# Patient Record
Sex: Female | Born: 1990 | Hispanic: Yes | Smoking: Never smoker
Health system: Southern US, Community
[De-identification: ages and names within clinical notes are randomized; demographics above are authoritative.]

## PROBLEM LIST (undated history)

## (undated) HISTORY — PX: OVARIAN CYST SURGERY: SHX726

---

## 2021-02-16 ENCOUNTER — Ambulatory Visit (LOCAL_COMMUNITY_HEALTH_CENTER): Payer: Medicaid Other

## 2021-02-16 ENCOUNTER — Other Ambulatory Visit: Payer: Self-pay

## 2021-02-16 VITALS — BP 102/71 | Wt 143.0 lb

## 2021-02-16 DIAGNOSIS — Z3201 Encounter for pregnancy test, result positive: Secondary | ICD-10-CM

## 2021-02-16 MED ORDER — PRENATAL VITAMINS 28-0.8 MG PO TABS: 1.0000 | ORAL_TABLET | ORAL | 0 refills | Status: AC

## 2021-02-16 NOTE — Progress Notes (Signed)
Patient in clinic today for PT.  Patient informed of positive pregnancy.  Patient request prenatal care at [facility].  Patient referred to [facility] and [facility] for preadmission for services today.  Patient to contact [facility] to initiate care. Patient questions answered.  PNV and pregnancy packet given and reviewed.    Wendi Snipes, FNP

## 2021-02-19 NOTE — Progress Notes (Signed)
Chart reviewed by Pharmacist  Suzanne Walker PharmD, Contract Pharmacist at [facility]  

## 2021-02-27 ENCOUNTER — Other Ambulatory Visit: Payer: Self-pay

## 2021-02-27 ENCOUNTER — Encounter: Payer: Self-pay | Admitting: Advanced Practice Midwife

## 2021-02-27 ENCOUNTER — Ambulatory Visit: Payer: Medicaid Other | Admitting: Advanced Practice Midwife

## 2021-02-27 DIAGNOSIS — Z3481 Encounter for supervision of other normal pregnancy, first trimester: Secondary | ICD-10-CM | POA: Diagnosis not present

## 2021-02-27 DIAGNOSIS — Z3480 Encounter for supervision of other normal pregnancy, unspecified trimester: Secondary | ICD-10-CM | POA: Insufficient documentation

## 2021-02-27 DIAGNOSIS — R54 Age-related physical debility: Secondary | ICD-10-CM

## 2021-02-27 DIAGNOSIS — Z23 Encounter for immunization: Secondary | ICD-10-CM

## 2021-02-27 LAB — URINALYSIS
Bilirubin, UA: NEGATIVE
Glucose, UA: NEGATIVE
Ketones, UA: NEGATIVE
Leukocytes,UA: NEGATIVE
Nitrite, UA: NEGATIVE
Protein,UA: NEGATIVE
Specific Gravity, UA: 1.005 (ref 1.005–1.030)
Urobilinogen, Ur: 0.2 mg/dL (ref 0.2–1.0)
pH, UA: 6 (ref 5.0–7.5)

## 2021-02-27 LAB — WET PREP FOR TRICH, YEAST, CLUE
Trichomonas Exam: NEGATIVE
Yeast Exam: NEGATIVE

## 2021-02-27 LAB — HEMOGLOBIN, FINGERSTICK: Hemoglobin: 11.9 g/dL (ref 11.1–15.9)

## 2021-02-27 NOTE — Progress Notes (Addendum)
Patient here for new OB visit at about 12 5/7.  Lives with partner and her son. No [facility], no [facility] records. FOB is working and client not working outside her home. FOB, Ranae Pila, is in the waiting area and would like to come in to clinic to listen to baby's heartbeat. [place] last year [place] Flu vaccine given, tolerated well, VIS given. Peds list given, [facility] pamphlet given and [employer] number given as patient interested in BTL.Marland KitchenBurt Knack, RN

## 2021-02-27 NOTE — Progress Notes (Signed)
[facility] and [facility] contact card given. Burt Knack, RN

## 2021-02-27 NOTE — Progress Notes (Signed)
[place] DEPT [facility] [address] [phone]  INITIAL PRENATAL VISIT NOTE  Subjective:  Kristi Washington is a 30 y.o. nonsmoker G47P1001(11 yo son) at 10w5dbeing seen today [facility]. She feels "happy" about planned pregnancy x 4 years. 30yo employed FOB feels "happy" about pregnancy; in supportive 1 year relationship; he has 2 children with another woman (388and 230yo). [place] FOB [place]. She is unemployed and living with FOB and her son. LMP 11/30/20. Denies ER use or u/s this pregnancy. Denies cigs, vaping, cigars, MJ. Last ETOH 04/2020 (2 glasses wine). Last dental exam 2021. Completed 12th grade.  [place] 2021 [place]. Wants Quad screen.  She is currently monitored for the following issues for this low-risk pregnancy and has Prenatal care, subsequent pregnancy, first trimester on their problem list.  Patient reports no complaints.  Contractions: Not present. Vag. Bleeding: None.  Movement: Absent. Denies leaking of fluid.   Indications for ASA therapy (per uptodate) One of the following: Previous pregnancy with preeclampsia, especially early onset and with an adverse outcome No Multifetal gestation No Chronic hypertension No Type 1 or 2 diabetes mellitus No Chronic kidney disease No Autoimmune disease (antiphospholipid syndrome, systemic lupus erythematosus) No  Two or more of the following: Nulliparity No Obesity (body mass index >30 kg/m2) No Family history of preeclampsia in mother or sister No Age ?35 years No Sociodemographic characteristics (African American race, low socioeconomic level) Yes Personal risk factors (eg, previous pregnancy with low birth weight or small for gestational age infant, previous adverse pregnancy  outcome [eg, stillbirth], interval >10 years between pregnancies) Yes   The following portions of the patient's history were reviewed and updated as appropriate: allergies, current medications, past family history, past medical history, past social history, past surgical history and problem list. Problem list updated.  Objective:   Vitals:   02/27/21 0906 02/27/21 0917  BP: 115/71   Pulse: 88   Temp: 97.7 F (36.5 C)   Weight: 143 lb 3.2 oz (65 kg)   Height:  _0  (1.549 m)    Fetal Status: Fetal Heart Rate (bpm): 160 Fundal Height: 16 cm Movement: Absent  Presentation: Undeterminable   Physical Exam Vitals and nursing note reviewed.  Constitutional:      General: She is not in acute distress.    Appearance: Normal appearance. She is well-developed and normal weight.  HENT:     Head: Normocephalic and atraumatic.     Right Ear: External ear normal.     Left Ear: External ear normal.     Nose: Nose normal. No congestion or rhinorrhea.     Mouth/Throat:     Lips: Pink.     Mouth: Mucous membranes are moist.     Dentition: Normal dentition. No dental caries.     Pharynx: Oropharynx is clear. Uvula midline.     Comments: Dentition: good; last dental exam 2021 Eyes:     General: No scleral icterus.    Conjunctiva/sclera: Conjunctivae normal.  Neck:     Thyroid: No thyroid mass, thyromegaly or thyroid tenderness.  Cardiovascular:     Rate and Rhythm: Normal rate.     Pulses: Normal pulses.     Comments: Extremities are warm and well perfused Pulmonary:  Effort: Pulmonary effort is normal.     Breath sounds: Normal breath sounds.  Chest:     Chest wall: No mass.  Breasts:    Tanner Score is 5.     Breasts are symmetrical.     Right: Normal. No mass, nipple discharge or skin change.     Left: Normal. No mass, nipple discharge or skin change.  Abdominal:     Palpations: Abdomen is soft.     Tenderness: There is no abdominal tenderness.     Comments: Gravid, soft  without masses or tenderness; FH=16 wks, FHR=160  Genitourinary:    General: Normal vulva.     Exam position: Lithotomy position.     Pubic Area: No rash.      Labia:        Right: No rash.        Left: No rash.      Vagina: Normal. No vaginal discharge (white creamy leukorrhea, ph<4.5).     Cervix: Friability (friable to pap) present. No cervical motion tenderness.     Uterus: Normal. Enlarged (Gravid 16 wks size). Not tender.      Rectum: Normal. No external hemorrhoid.  Musculoskeletal:     Right lower leg: No edema.     Left lower leg: No edema.  Lymphadenopathy:     Cervical: No cervical adenopathy.     Upper Body:     Right upper body: No axillary adenopathy.     Left upper body: No axillary adenopathy.  Skin:    General: Skin is warm.     Capillary Refill: Capillary refill takes less than 2 seconds.  Neurological:     Mental Status: She is alert.    Assessment and Plan:  Pregnancy: G2P1001 at [redacted]w[redacted]d 1. Prenatal care, subsequent pregnancy, first trimester Desires Quad screen Please give pt dental list and encourage exam asap Dating u/s ordered Counseled on weight gain of 15-25 lbs this pregnancy - Hgb Fractionation Cascade - Chlamydia/GC NAA, Confirmation - HCV Ab w Reflex to Quant PCR - Urine Culture - CBC/D/Plt+RPR+Rh+ABO+Rub Ab... - HIV-1/HIV-2 Qualitative RNA - QuantiFERON-TB Gold Plus -- 737106Drug Screen - IGP, rfx Aptima HPV ASCU - WET PREP FOR TRICH, YEAST, CLUE - Urinalysis (Urine Dip) - Pregnancy, urine    Discussed overview of care and coordination with inpatient delivery practices including [facility]   Reviewed Centering pregnancy as standard of care at [facility]    Preterm labor symptoms and general obstetric precautions including but not limited to vaginal bleeding, contractions, leaking of fluid and fetal movement were reviewed in detail with the patient.  Please refer to After Visit Summary for other  counseling recommendations.   No follow-ups on file.  No future appointments.  EHerbie Saxon CNM

## 2021-02-28 LAB — CBC/D/PLT+RPR+RH+ABO+RUB AB...
Antibody Screen: NEGATIVE
Basophils Absolute: 0.1 10*3/uL (ref 0.0–0.2)
Basos: 1 %
EOS (ABSOLUTE): 0.2 10*3/uL (ref 0.0–0.4)
Eos: 2 %
Hematocrit: 36.1 % (ref 34.0–46.6)
Hemoglobin: 12.2 g/dL (ref 11.1–15.9)
Hepatitis B Surface Ag: NEGATIVE
Immature Grans (Abs): 0 10*3/uL (ref 0.0–0.1)
Immature Granulocytes: 0 %
Lymphocytes Absolute: 3.1 10*3/uL (ref 0.7–3.1)
Lymphs: 35 %
MCH: 31.2 pg (ref 26.6–33.0)
MCHC: 33.8 g/dL (ref 31.5–35.7)
MCV: 92 fL (ref 79–97)
Monocytes Absolute: 0.5 10*3/uL (ref 0.1–0.9)
Monocytes: 6 %
Neutrophils Absolute: 5.1 10*3/uL (ref 1.4–7.0)
Neutrophils: 56 %
Platelets: 337 10*3/uL (ref 150–450)
RBC: 3.91 x10E6/uL (ref 3.77–5.28)
RDW: 12.7 % (ref 11.7–15.4)
RPR Ser Ql: NONREACTIVE
Rh Factor: POSITIVE
Rubella Antibodies, IGG: 6.86 index (ref >0.99)
Varicella zoster IgG: 375 index (ref >165)
WBC: 9.1 10*3/uL (ref 3.4–10.8)

## 2021-02-28 LAB — HCV AB W REFLEX TO QUANT PCR: HCV Ab: <0.1 s/co ratio (ref 0.0–0.9)

## 2021-02-28 LAB — HCV INTERPRETATION

## 2021-03-01 LAB — 789231 7+OXYCODONE-BUND
Amphetamines, Urine: NEGATIVE ng/mL
BENZODIAZ UR QL: NEGATIVE ng/mL
Barbiturate screen, urine: NEGATIVE ng/mL
Cannabinoid Quant, Ur: NEGATIVE ng/mL
Cocaine (Metab.): NEGATIVE ng/mL
OPIATE SCREEN URINE: NEGATIVE ng/mL
Oxycodone/Oxymorphone, Urine: NEGATIVE ng/mL
PCP Quant, Ur: NEGATIVE ng/mL

## 2021-03-01 LAB — IGP, RFX APTIMA HPV ASCU: PAP Smear Comment: 0

## 2021-03-01 LAB — CHLAMYDIA/GC NAA, CONFIRMATION
Chlamydia trachomatis, NAA: NEGATIVE
Neisseria gonorrhoeae, NAA: NEGATIVE

## 2021-03-02 LAB — URINE CULTURE: Organism ID, Bacteria: NO GROWTH

## 2021-03-04 LAB — HGB FRACTIONATION CASCADE
Hgb A2: 2.8 % (ref 1.8–3.2)
Hgb A: 97.2 % (ref 96.4–98.8)
Hgb F: 0 % (ref 0.0–2.0)
Hgb S: 0 %

## 2021-03-04 LAB — QUANTIFERON-TB GOLD PLUS
QuantiFERON Mitogen Value: >10 [IU]/mL
QuantiFERON Nil Value: 2.79 [IU]/mL
QuantiFERON TB1 Ag Value: >10 [IU]/mL
QuantiFERON TB2 Ag Value: >10 [IU]/mL
QuantiFERON-TB Gold Plus: POSITIVE — AB

## 2021-03-04 LAB — HIV-1/HIV-2 QUALITATIVE RNA
HIV-1 RNA, Qualitative: NONREACTIVE
HIV-2 RNA, Qualitative: NONREACTIVE

## 2021-03-06 ENCOUNTER — Encounter: Payer: Self-pay | Admitting: Advanced Practice Midwife

## 2021-03-06 DIAGNOSIS — R7612 Nonspecific reaction to cell mediated immunity measurement of gamma interferon antigen response without active tuberculosis: Secondary | ICD-10-CM | POA: Insufficient documentation

## 2021-03-09 ENCOUNTER — Telehealth: Payer: Self-pay

## 2021-03-09 ENCOUNTER — Ambulatory Visit (LOCAL_COMMUNITY_HEALTH_CENTER): Payer: Medicaid Other

## 2021-03-09 VITALS — Wt 142.0 lb

## 2021-03-09 DIAGNOSIS — R7612 Nonspecific reaction to cell mediated immunity measurement of gamma interferon antigen response without active tuberculosis: Secondary | ICD-10-CM

## 2021-03-09 NOTE — Progress Notes (Signed)
Patient interviewed by phone. Patient was screened for TB during maternity visit, she is approximately [redacted] weeks along. She was last seen in maternity on 02/27/2021.  Labs from 02/27/2021: QFT+ QuantiFERON TB1 Ag Value IU/mL >10.00  QuantiFERON TB2 Ag Value IU/mL >10.00  QuantiFERON Nil Value IU/mL 2.79  QuantiFERON Mitogen Value IU/mL >10.00  QuantiFERON-TB Gold Plus Negative Positive Abnormal    HIV-1 RNA, Qualitative Non Reactive HIV-2 RNA, Qualitative Non Reactive  Drug screen negative HBV, HCV negative  The patient is spanish speaking, an interpreter was used. She has no known TB contacts and does not complain of any symptoms concerning for TB at present. She does not have any medical conditions or social habits that would make her high risk for TB.  The patient was instructed that she should obtain a chest x-ray in order to determine whether or not she has active TB and that once her CXR is completed we would notify her of the results and discuss treatment options.   Irena Cords, MD

## 2021-03-10 ENCOUNTER — Other Ambulatory Visit: Payer: Self-pay | Admitting: Family Medicine

## 2021-03-10 ENCOUNTER — Ambulatory Visit: Admission: RE | Admit: 2021-03-10 | Payer: Self-pay | Source: Home / Self Care

## 2021-03-10 ENCOUNTER — Ambulatory Visit
Admission: RE | Admit: 2021-03-10 | Discharge: 2021-03-10 | Disposition: A | Discharge status: Home / Self Care | Payer: Self-pay | Source: Ambulatory Visit | Attending: Family Medicine | Admitting: Family Medicine

## 2021-03-10 ENCOUNTER — Telehealth: Payer: Self-pay

## 2021-03-10 ENCOUNTER — Other Ambulatory Visit: Payer: Self-pay

## 2021-03-10 DIAGNOSIS — R7612 Nonspecific reaction to cell mediated immunity measurement of gamma interferon antigen response without active tuberculosis: Secondary | ICD-10-CM

## 2021-03-10 NOTE — Telephone Encounter (Signed)
Epi completed via phone.  Irena Cords MD

## 2021-03-10 NOTE — Telephone Encounter (Signed)
Call from Angie at [facility] as client there for CXR 1 view due to recent positive Quantiferon TB Gold test result. If pregnant, [facility] requires client to sign consent for XRAY after counseling by MD regarding risks. Angie requesting to speak specifically with Dr. Alvester Morin so client could be counseled (RN not acceptable). As Dr. Alvester Morin at [facility] today, unable to speak with client and Karoline Caldwell states she will have her radiologist speak with client. Jossie Ng, RN

## 2021-03-22 ENCOUNTER — Telehealth: Payer: Self-pay | Admitting: Surgery

## 2021-03-22 NOTE — Telephone Encounter (Signed)
Patient went for CXR on 12/2 but when asked to sign consent form re: CXR during pregnancy she refused because staff said she had to have specific counseling with an MD.   Spanish interpreter used to counsel patient that diagnostic levels of radiation (versus high dose radiation for treatment) of the extremities and chest are very low risk for birth defects. High dose radiation to the abdomen, and during the first 16 weeks of pregnancy can cause potential developmental abnormalities such as growth restriction or learning disabilities.  This patient will be shielded, only the chest will be imaged, diagnostic levels of radiation will be used and the fetus is at 16 weeks of development, thus the risks of birth defects is extremely low.   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   Jennye Moccasin, MD

## 2021-03-24 ENCOUNTER — Ambulatory Visit
Admission: RE | Admit: 2021-03-24 | Discharge: 2021-03-24 | Disposition: A | Discharge status: Home / Self Care | Payer: Self-pay | Source: Ambulatory Visit | Attending: Family Medicine | Admitting: Family Medicine

## 2021-03-24 ENCOUNTER — Other Ambulatory Visit: Payer: Self-pay | Admitting: Family Medicine

## 2021-03-24 ENCOUNTER — Ambulatory Visit
Admission: RE | Admit: 2021-03-24 | Discharge: 2021-03-24 | Disposition: A | Discharge status: Home / Self Care | Payer: Self-pay | Attending: Family Medicine | Admitting: Family Medicine

## 2021-03-24 DIAGNOSIS — R7612 Nonspecific reaction to cell mediated immunity measurement of gamma interferon antigen response without active tuberculosis: Secondary | ICD-10-CM

## 2021-03-27 ENCOUNTER — Encounter: Payer: Self-pay | Admitting: Nurse Practitioner

## 2021-03-27 ENCOUNTER — Ambulatory Visit: Payer: Medicaid Other | Admitting: Nurse Practitioner

## 2021-03-27 ENCOUNTER — Other Ambulatory Visit: Payer: Self-pay

## 2021-03-27 VITALS — BP 106/72 | HR 100 | Temp 97.9°F | Wt 145.4 lb

## 2021-03-27 DIAGNOSIS — Z3482 Encounter for supervision of other normal pregnancy, second trimester: Secondary | ICD-10-CM

## 2021-03-27 NOTE — Progress Notes (Signed)
Aware of [facility] [place] appt 04/07/2021. Verified has [facility] [place] appt. Desires Quad screen today. Jossie Ng, RN

## 2021-03-27 NOTE — Progress Notes (Signed)
[facility] M[facility] Health Clinic  PRENATAL VISIT NOTE  Subjective:  Kristi Washington is a 30 y.o. G2P1001 at [redacted]w[redacted]d being seen today for ongoing prenatal care.  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  Patient reports no complaints.  Contractions: Not present. Vag. Bleeding: None.  Movement: Absent. Denies leaking of fluid/ROM.   The following portions of the patient's history were reviewed and updated as appropriate: allergies, current medications, past family history, past medical history, past social history, past surgical history and problem list. Problem list updated.  Objective:   Vitals:   03/27/21 1005  BP: 106/72  Pulse: 100  Temp: 97.9 F (36.6 C)  Weight: 145 lb 6.4 oz (66 kg)    Fetal Status: Fetal Heart Rate (bpm): 150 Fundal Height: 18 cm Movement: Absent     General:  Alert, oriented and cooperative. Patient is in no acute distress.  Skin: Skin is warm and dry. No rash noted.   Cardiovascular: Normal heart rate noted  Respiratory: Normal respiratory effort, no problems with respiration noted  Abdomen: Soft, gravid, appropriate for gestational age.  Pain/Pressure: Absent     Pelvic: Cervical exam deferred        Extremities: Normal range of motion.  Edema: None  Mental Status: Normal mood and affect. Normal behavior. Normal judgment and thought content.   Assessment and Plan:  Pregnancy: G2P1001 at [redacted]w[redacted]d  1. Prenatal care, subsequent pregnancy, second trimester -Patient is a 30 year old female in clinic today for a routine prenatal visit. -Patient has no complaints, states she is taking her PNV daily. -Patient sent to the lab today for Quad screen. -Next appointment in 4 weeks.  - AFP TETRA   Term labor symptoms and general  obstetric precautions including but not limited to vaginal bleeding, contractions, leaking of fluid and fetal movement were reviewed in detail with the patient. Please refer to After Visit Summary for other counseling recommendations.   Due to language barrier, an interpreter Roddie Mc Yemen) was used for the provider portion of the visit.    Return in about 4 weeks (around 04/24/2021) for Routine prenatal care visit.    Glenna Fellows, FNP

## 2021-03-30 ENCOUNTER — Telehealth: Payer: Self-pay | Admitting: Surgery

## 2021-03-30 LAB — AFP TETRA
DIA Value (EIA): 396.8 pg/mL
DSR (By Age)    1 IN: 716
Gestational Age: 14 WEEKS
MSAFP: 33.4 ng/mL
MSHCG: 36289 m[IU]/mL
Maternal Age At EDD: 29.8 yr
T18 (By Age): 1:2788 {titer}
Weight: 145 [lb_av]
uE3 Value: 1.49 ng/mL

## 2021-03-30 NOTE — Telephone Encounter (Signed)
2nd attempt to call but no answer. Left voicemail with interpreter that CXR results are normal and I will call her later next week to discuss treatment options for LTBI.  Jennye Moccasin, MD

## 2021-04-05 NOTE — Addendum Note (Signed)
Addended by: Wendi Snipes on: 04/05/2021 09:27 PM   Modules accepted: Orders

## 2021-04-06 LAB — PREGNANCY, URINE: Preg Test, Ur: POSITIVE — AB

## 2021-04-11 ENCOUNTER — Telehealth: Payer: Self-pay

## 2021-04-11 NOTE — Telephone Encounter (Signed)
At [facility] = 16 5/7, client had AFP Tetra drawn. When lab ordered, writer must have inadvertently entered in EGA = 14 weeks, not 16 weeks. Therefore, testing was not performed as less than 15 weeks. Call to client to offer appt to have testing re-drawn prior to next appt. Per client, she will have test re-drawn at 04/21/2021 appt. Jossie Ng, RN

## 2021-04-17 ENCOUNTER — Telehealth: Payer: Self-pay | Admitting: Surgery

## 2021-04-17 NOTE — Telephone Encounter (Signed)
Patient is QFT+ and had negative CXR, no pulmonary symptoms. 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  Patient will be [redacted] weeks GA on 04/19/2021, had recent US at [facility] 12/30. Spoke with pt on phone today using a spanish interpreter. She is interested in starting LTBI treatment with Rifampin 600mg  daily for 4 months.   Patient has maternity appointment this Friday at 10am. Following maternity appt, we will see patient, educate on treatment plan, dispense meds, sign consent forms and have CBC and LFTs drawn, most recent lab values were from 02/2021.  03/2021, MD

## 2021-04-20 ENCOUNTER — Other Ambulatory Visit: Payer: Self-pay | Admitting: Surgery

## 2021-04-20 DIAGNOSIS — R7612 Nonspecific reaction to cell mediated immunity measurement of gamma interferon antigen response without active tuberculosis: Secondary | ICD-10-CM

## 2021-04-20 NOTE — Progress Notes (Signed)
Tuberculosis treatment orders  All patients are to be monitored per [place] and county TB policies.    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   Treat for latent TB per the following: Rifampin 600mg  daily by mouth x 4 months per Dr. .  Draw CBC and LFTs at first med dispense. Patient is pregnant at [redacted] weeks. Draw LFTs monthly for remaining visits.  QFT+ 02/27/2021 HIV test negative 02/27/2021 CXR negative 03/24/2021 LMP: 11/30/2020

## 2021-04-21 ENCOUNTER — Telehealth: Payer: Self-pay

## 2021-04-21 ENCOUNTER — Ambulatory Visit: Payer: Self-pay | Admitting: Advanced Practice Midwife

## 2021-04-21 ENCOUNTER — Other Ambulatory Visit: Payer: Self-pay

## 2021-04-21 ENCOUNTER — Ambulatory Visit (LOCAL_COMMUNITY_HEALTH_CENTER): Payer: Self-pay | Admitting: Surgery

## 2021-04-21 VITALS — Wt 147.0 lb

## 2021-04-21 VITALS — BP 84/52 | HR 92 | Temp 99.2°F | Wt 147.8 lb

## 2021-04-21 DIAGNOSIS — R7612 Nonspecific reaction to cell mediated immunity measurement of gamma interferon antigen response without active tuberculosis: Secondary | ICD-10-CM

## 2021-04-21 DIAGNOSIS — Z3481 Encounter for supervision of other normal pregnancy, first trimester: Secondary | ICD-10-CM

## 2021-04-21 DIAGNOSIS — Z3482 Encounter for supervision of other normal pregnancy, second trimester: Secondary | ICD-10-CM

## 2021-04-21 MED ORDER — RIFAMPIN 300 MG PO CAPS: 600.0000 mg | ORAL_CAPSULE | Freq: Every day | ORAL | 0 refills | Status: AC

## 2021-04-21 NOTE — Progress Notes (Signed)
[facility] Department Maternal Health Clinic  PRENATAL VISIT NOTE  Subjective:  Kristi Washington is a 31 y.o. G2P1001 at [redacted]w[redacted]d being seen today for ongoing prenatal care.  She is currently monitored for the following issues for this low-risk pregnancy and has Prenatal care, subsequent pregnancy, first trimester; advanced paternal age 56; and Positive QuantiFERON-TB Gold test 02/27/21 on their problem list.  Patient reports no complaints.  Contractions: Not present. Vag. Bleeding: None.  Movement: Absent. Denies leaking of fluid/ROM.   The following portions of the patient's history were reviewed and updated as appropriate: allergies, current medications, past family history, past medical history, past social history, past surgical history and problem list. Problem list updated.  Objective:   Vitals:   04/21/21 0950  BP: (!) 84/52  Pulse: 92  Temp: 99.2 F (37.3 C)  Weight: 147 lb 12.8 oz (67 kg)    Fetal Status: Fetal Heart Rate (bpm): 150 Fundal Height: 23 cm Movement: Absent     General:  Alert, oriented and cooperative. Patient is in no acute distress.  Skin: Skin is warm and dry. No rash noted.   Cardiovascular: Normal heart rate noted  Respiratory: Normal respiratory effort, no problems with respiration noted  Abdomen: Soft, gravid, appropriate for gestational age.  Pain/Pressure: Absent     Pelvic: Cervical exam deferred        Extremities: Normal range of motion.  Edema: None  Mental Status: Normal mood and affect. Normal behavior. Normal judgment and thought content.   Assessment and Plan:  Pregnancy: G2P1001 at [redacted]w[redacted]d  1. 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 Prenatal care, subsequent pregnancy, first trimester Reviewed 04/07/21 u/s at 18 2/7 with 3VC, AFI wnl, anatomy incomplete so f/u 05/11/21 Redraw Quad screen today as done too early on 04/21/21 Not working Living with partner and  52 yo child Denies h/a, sore throat, cough, achy body - AFP TETRA   Preterm labor symptoms and general obstetric precautions including but not limited to vaginal bleeding, contractions, leaking of fluid and fetal movement were reviewed in detail with the patient. Please refer to After Visit Summary for other counseling recommendations.  No follow-ups on file.  Future Appointments  Date Time Provider Department Center  04/21/2021  1:30 PM [facility] NURSE [facility] None  05/19/2021 10:00 AM [facility] PROVIDER [facility] None    Alberteen Spindle, CNM

## 2021-04-21 NOTE — Progress Notes (Signed)
Patient here for MH RV at 20w 2d.   QUAD screen re-drawn today as last draw on 12/19 patient was too early gestational age at 18 wks. Consent signed today.   Patient is interested in [place]. BTL booklet information given and phone number for [place] clinic. Gave patient also [facility] application.   Dr. Lolita Lenz last patient today and added lab orders for her (LFTs and CBC).   Adalberto Cole, RN

## 2021-04-21 NOTE — Progress Notes (Signed)
Patient is 30yo female pregnant with her second child at [redacted]w[redacted]d. During her routine pregnancy workup she had a +QFT. CXR was negative for active disease. 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 She was seen today in conjunction with a routine maternity visit.  Patient given month #1 of Rifampin at visit, dispensed Rifampin 300 mg #60. Patient to follow up in approximately 30 days for dispensing of next month's meds.    Consent to treat signed, medication reviewed, side effects discussed, treatment plan of 4 months with monthly LFTs reviewed with patient with spanish interpreter, Salli Real, present.  Jennye Moccasin, MD

## 2021-04-22 LAB — CBC WITH DIFFERENTIAL/PLATELET
Basophils Absolute: 0 10*3/uL (ref 0.0–0.2)
Basos: 0 %
EOS (ABSOLUTE): 0.2 10*3/uL (ref 0.0–0.4)
Eos: 2 %
Hematocrit: 33.9 % — ABNORMAL LOW (ref 34.0–46.6)
Hemoglobin: 11.4 g/dL (ref 11.1–15.9)
Immature Grans (Abs): 0.1 10*3/uL (ref 0.0–0.1)
Immature Granulocytes: 1 %
Lymphocytes Absolute: 2.3 10*3/uL (ref 0.7–3.1)
Lymphs: 25 %
MCH: 31.3 pg (ref 26.6–33.0)
MCHC: 33.6 g/dL (ref 31.5–35.7)
MCV: 93 fL (ref 79–97)
Monocytes Absolute: 0.5 10*3/uL (ref 0.1–0.9)
Monocytes: 5 %
Neutrophils Absolute: 6 10*3/uL (ref 1.4–7.0)
Neutrophils: 67 %
Platelets: 312 10*3/uL (ref 150–450)
RBC: 3.64 x10E6/uL — ABNORMAL LOW (ref 3.77–5.28)
RDW: 12.5 % (ref 11.7–15.4)
WBC: 9 10*3/uL (ref 3.4–10.8)

## 2021-04-22 LAB — HEPATIC FUNCTION PANEL
ALT: 6 IU/L (ref 0–32)
AST: 13 IU/L (ref 0–40)
Albumin: 4.1 g/dL (ref 3.9–5.0)
Alkaline Phosphatase: 91 IU/L (ref 44–121)
Bilirubin Total: <0.2 mg/dL (ref 0.0–1.2)
Bilirubin, Direct: <0.1 mg/dL (ref 0.00–0.40)
Total Protein: 6.7 g/dL (ref 6.0–8.5)

## 2021-04-23 LAB — AFP TETRA
DIA Mom Value: 1.92
DIA Value (EIA): 365.75 pg/mL
DSR (By Age)    1 IN: 724
DSR (Second Trimester) 1 IN: 4524
Gestational Age: 20.2 WEEKS
MSAFP Mom: 1.02
MSAFP: 62.8 ng/mL
MSHCG Mom: 0.49
MSHCG: 13034 m[IU]/mL
Maternal Age At EDD: 29.7 yr
Osb Risk: 10000
T18 (By Age): 1:2821 {titer}
Test Results:: NEGATIVE
Weight: 147 [lb_av]
uE3 Mom: 1.28
uE3 Value: 3.1 ng/mL

## 2021-04-28 NOTE — Progress Notes (Signed)
Patient's CBC/LFTs are wnl except for Hgb/Hct which is just below normal limit. No treatment needed at this time, on PNV, but needs CBC along with LFTs at next appt for TB meds since Hgb/Hct is trending downward.  Jennye Moccasin, MD

## 2021-05-19 ENCOUNTER — Ambulatory Visit: Payer: Self-pay | Admitting: Advanced Practice Midwife

## 2021-05-19 ENCOUNTER — Ambulatory Visit (LOCAL_COMMUNITY_HEALTH_CENTER): Payer: Self-pay

## 2021-05-19 ENCOUNTER — Other Ambulatory Visit: Payer: Self-pay

## 2021-05-19 VITALS — Wt 152.0 lb

## 2021-05-19 VITALS — BP 86/55 | HR 100 | Temp 98.9°F | Wt 152.6 lb

## 2021-05-19 DIAGNOSIS — R54 Age-related physical debility: Secondary | ICD-10-CM

## 2021-05-19 DIAGNOSIS — Z3481 Encounter for supervision of other normal pregnancy, first trimester: Secondary | ICD-10-CM

## 2021-05-19 DIAGNOSIS — R7612 Nonspecific reaction to cell mediated immunity measurement of gamma interferon antigen response without active tuberculosis: Secondary | ICD-10-CM

## 2021-05-19 DIAGNOSIS — Z3482 Encounter for supervision of other normal pregnancy, second trimester: Secondary | ICD-10-CM

## 2021-05-19 MED ORDER — PRENATAL MULTIVITAMIN CH: 1.0000 | ORAL_TABLET | Freq: Every day | ORAL | 0 refills | Status: AC

## 2021-05-19 MED ORDER — RIFAMPIN 300 MG PO CAPS: 600.0000 mg | ORAL_CAPSULE | Freq: Every day | ORAL | 0 refills | Status: AC

## 2021-05-19 NOTE — Progress Notes (Signed)
[facility] Department Maternal Health Clinic  PRENATAL VISIT NOTE  Subjective:  Kristi Washington is a 31 y.o. G2P1001 at [redacted]w[redacted]d being seen today for ongoing prenatal care.  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  Patient reports no complaints.  Contractions: Not present. Vag. Bleeding: None.  Movement: Present. Denies leaking of fluid/ROM.   The following portions of the patient's history were reviewed and updated as appropriate: allergies, current medications, past family history, past medical history, past social history, past surgical history and problem list. Problem list updated.  Objective:   Vitals:   05/19/21 0953  BP: (!) 86/55  Pulse: 100  Temp: 98.9 F (37.2 C)  Weight: 152 lb 9.6 oz (69.2 kg)    Fetal Status: Fetal Heart Rate (bpm): 160 Fundal Height: 26 cm Movement: Present     General:  Alert, oriented and cooperative. Patient is in no acute distress.  Skin: Skin is warm and dry. No rash noted.   Cardiovascular: Normal heart rate noted  Respiratory: Normal respiratory effort, no problems with respiration noted  Abdomen: Soft, gravid, appropriate for gestational age.  Pain/Pressure: Absent     Pelvic: Cervical exam deferred        Extremities: Normal range of motion.  Edema: None  Mental Status: Normal mood and affect. Normal behavior. Normal judgment and thought content.   Assessment and Plan:  Pregnancy: G2P1001 at [redacted]w[redacted]d  1. Prenatal care, subsequent pregnancy, first trimester Reviewed 05/11/21 anatomy u/s  at 23 2/7 with EFW=50%, appropriate growth, AFI wnl, posterior placenta - Prenatal Vit-Fe Fumarate-FA (PRENATAL MULTIVITAMIN) TABS tablet; Take 1 tablet by mouth daily at 12 noon.  Dispense: 100 tablet; Refill: 0 - Hepatic function  panel  2. 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 advanced paternal age 35 Declines genetic screening   Preterm labor symptoms and general obstetric precautions including but not limited to vaginal bleeding, contractions, leaking of fluid and fetal movement were reviewed in detail with the patient. Please refer to After Visit Summary for other counseling recommendations.  Return in about 4 weeks (around 06/16/2021) for routine PNC.  No future appointments.  Alberteen Spindle, CNM

## 2021-05-19 NOTE — Progress Notes (Signed)
Rifampin 300mg  #60 dispensed to patient.  States she has today and tomorrow's dose left in first bottle.  MH nurse will have LFTs drawn today before patient leaves maternity appt.  TB control will schedule patient's next appt.coinciding with patient's next MH appt. , RN   Interpreter V. Olmedo

## 2021-05-19 NOTE — Progress Notes (Signed)
Patient is here for MH RV at 24w 2d.   PHQ9 given to patient. Score = 5.  CMHRP and CCNC forms filled out today during clinic.    PNV dispensed to patient during clinic.   Floy Sabina, RN

## 2021-05-20 LAB — HEPATIC FUNCTION PANEL
ALT: 32 IU/L (ref 0–32)
AST: 30 IU/L (ref 0–40)
Albumin: 3.9 g/dL (ref 3.9–5.0)
Alkaline Phosphatase: 135 IU/L — ABNORMAL HIGH (ref 44–121)
Bilirubin Total: 0.2 mg/dL (ref 0.0–1.2)
Bilirubin, Direct: <0.1 mg/dL (ref 0.00–0.40)
Total Protein: 6.2 g/dL (ref 6.0–8.5)

## 2021-05-22 ENCOUNTER — Telehealth: Payer: Self-pay | Admitting: Surgery

## 2021-05-22 DIAGNOSIS — R7612 Nonspecific reaction to cell mediated immunity measurement of gamma interferon antigen response without active tuberculosis: Secondary | ICD-10-CM

## 2021-05-22 NOTE — Telephone Encounter (Signed)
Patient has slightly elevated AlkP level on labs. At recent visit no symptoms reported but calling to check on patient and to urge her to contact us if any symtpoms do arise. Will continue current treatment plans despite small bump in AlkP if no sx.   Will attempt to reach patient again tomorrow/Weds.  Jennye Moccasin, MD

## 2021-05-26 ENCOUNTER — Telehealth: Payer: Self-pay | Admitting: Surgery

## 2021-05-26 DIAGNOSIS — R7612 Nonspecific reaction to cell mediated immunity measurement of gamma interferon antigen response without active tuberculosis: Secondary | ICD-10-CM

## 2021-05-26 NOTE — Telephone Encounter (Signed)
Pt pregnant and on tx for LTBI, Rifampin. Had slightly elevated AlkP on last labs.  Pt has occasional nausea sx, though sounds like her baseline during pregnancy. No vomiting, normal appetite. Explained slight elevation of ALKP and to avoid a lot of fatty foods if possible. Reports she is taking her RIF as directed. Made appt to return for RIF #2 March 10th 9am.  Jennye Moccasin, MD

## 2021-06-16 ENCOUNTER — Ambulatory Visit (LOCAL_COMMUNITY_HEALTH_CENTER): Payer: Self-pay

## 2021-06-16 ENCOUNTER — Other Ambulatory Visit: Payer: Self-pay

## 2021-06-16 ENCOUNTER — Ambulatory Visit: Payer: Self-pay | Admitting: Advanced Practice Midwife

## 2021-06-16 VITALS — Wt 156.5 lb

## 2021-06-16 VITALS — BP 105/69 | HR 105 | Temp 98.2°F | Wt 157.2 lb

## 2021-06-16 DIAGNOSIS — Z3481 Encounter for supervision of other normal pregnancy, first trimester: Secondary | ICD-10-CM

## 2021-06-16 DIAGNOSIS — R7612 Nonspecific reaction to cell mediated immunity measurement of gamma interferon antigen response without active tuberculosis: Secondary | ICD-10-CM

## 2021-06-16 DIAGNOSIS — R54 Age-related physical debility: Secondary | ICD-10-CM

## 2021-06-16 DIAGNOSIS — Z3483 Encounter for supervision of other normal pregnancy, third trimester: Secondary | ICD-10-CM

## 2021-06-16 DIAGNOSIS — Z23 Encounter for immunization: Secondary | ICD-10-CM

## 2021-06-16 LAB — HEMOGLOBIN, FINGERSTICK: Hemoglobin: 12.1 g/dL (ref 11.1–15.9)

## 2021-06-16 MED ORDER — RIFAMPIN 300 MG PO CAPS: 600.0000 mg | ORAL_CAPSULE | Freq: Every day | ORAL | 0 refills | Status: AC

## 2021-06-16 NOTE — Progress Notes (Signed)
In Nurse Clinic for LTBI / TB med management / Due for Rifampin bottle #3 today. Kristi Washington, interpreter. ? ?Voices no complaints.Takes Rifampin daily as prescribed and denies missing any pills. Pt reports she has 10 pills (5 days worth) remaining in current bottle.  ? ?Rifampin 300 mg #60 dispensed today (Bottle #3) per order Dr Vertell Novak dated 04/20/2021. Instructions explained.  ?Rifampin info sheet  and TB coord contact card given.  ?Advised to contact [facility] with questions, concerns, side effects. Reports understanding.  ? ?Pt is due for LFT's today. Has [facility] visit appt after TB appt today. Per Wannetta Sender, RN, [facility] will send pt to lab for blood work, including LFT's. Lab notified of need for LFT today and order sent to lab.  ? ?Pt will be due for TB med completion appt in one month, approx 07/14/2021. Plan to coordinate next TB appt with [facility] appt. Josie Saunders, RN ? ?

## 2021-06-16 NOTE — Progress Notes (Signed)
[facility] Department ?Maternal Health Clinic ? ?PRENATAL VISIT NOTE ? ?Subjective:  ?Kristi Washington is a 31 y.o. G2P1001 at [redacted]w[redacted]d being seen today for ongoing prenatal care.  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 ? ?Patient reports no complaints.  Contractions: Not present. Vag. Bleeding: None.  Movement: Present. Denies leaking of fluid/ROM.  ? ?The following portions of the patient's history were reviewed and updated as appropriate: allergies, current medications, past family history, past medical history, past social history, past surgical history and problem list. Problem list updated. ? ?Objective:  ? ?Vitals:  ? 06/16/21 1013  ?BP: 105/69  ?Pulse: (!) 105  ?Temp: 98.2 ?F (36.8 ?C)  ?Weight: 157 lb 3.2 oz (71.3 kg)  ? ? ?Fetal Status: Fetal Heart Rate (bpm): 140 Fundal Height: 28 cm Movement: Present    ? ?General:  Alert, oriented and cooperative. Patient is in no acute distress.  ?Skin: Skin is warm and dry. No rash noted.   ?Cardiovascular: Normal heart rate noted  ?Respiratory: Normal respiratory effort, no problems with respiration noted  ?Abdomen: Soft, gravid, appropriate for gestational age.  Pain/Pressure: Absent     ?Pelvic: Cervical exam deferred        ?Extremities: Normal range of motion.  Edema: None  ?Mental Status: Normal mood and affect. Normal behavior. Normal judgment and thought content.  ? ?Assessment and Plan:  ?Pregnancy: G2P1001 at [redacted]w[redacted]d ? ?1. Prenatal care, subsequent pregnancy, first trimester ?Not working ?Walking 3-4x/wk x 15-20 min.  ?6 lb 3.2 oz (2.812 kg) ?28 wk labs today ?- Glucose, 1 hour ?- HIV-1/HIV-2 Qualitative RNA ?- RPR ?- Hemoglobin, venipuncture ? ?2. advanced paternal age 75 ?Declined genetic counseling ? ?3. Positive QuantiFERON-TB Gold test  02/27/21 ?Taking Rifampin daily ? ? ?Preterm labor symptoms and general obstetric precautions including but not limited to vaginal bleeding, contractions, leaking of fluid and fetal movement were reviewed in detail with the patient. ?Please refer to After Visit Summary for other counseling recommendations.  ?Return in about 2 weeks (around 06/30/2021) for routine PNC. ? ?No future appointments. ? ?Alberteen Spindle, CNM ? ?

## 2021-06-16 NOTE — Progress Notes (Addendum)
28 week labs today. Verified client has Recruitment consultant card and Pediatrician Resource List. Kristi Ng, RN ?Counseled regarding recommendation for Tdap in pregnancy and for those who will spend any amt of time around infant. Client tolerated Tdap today without complaint. Kristi Washington, RNHgb = 12.1 and no intervention required per standing order. Kristi Ng, RN ? ? ? ?

## 2021-06-17 LAB — RPR: RPR Ser Ql: NONREACTIVE

## 2021-06-17 LAB — HEPATIC FUNCTION PANEL
ALT: 34 IU/L — ABNORMAL HIGH (ref 0–32)
AST: 31 IU/L (ref 0–40)
Albumin: 3.6 g/dL — ABNORMAL LOW (ref 3.9–5.0)
Alkaline Phosphatase: 165 IU/L — ABNORMAL HIGH (ref 44–121)
Bilirubin Total: 0.2 mg/dL (ref 0.0–1.2)
Bilirubin, Direct: 0.11 mg/dL (ref 0.00–0.40)
Total Protein: 6.2 g/dL (ref 6.0–8.5)

## 2021-06-17 LAB — HIV-1/HIV-2 QUALITATIVE RNA
HIV-1 RNA, Qualitative: NONREACTIVE
HIV-2 RNA, Qualitative: NONREACTIVE

## 2021-06-17 LAB — GLUCOSE, 1 HOUR GESTATIONAL: Gestational Diabetes Screen: 135 mg/dL (ref 70–139)

## 2021-06-19 ENCOUNTER — Telehealth: Payer: Self-pay

## 2021-06-19 DIAGNOSIS — R7309 Other abnormal glucose: Secondary | ICD-10-CM | POA: Insufficient documentation

## 2021-06-19 NOTE — Telephone Encounter (Signed)
Call to client as needs 3 hour GTT (one hour = 135) and appt scheduled for 06/22/2021 with arrival time of 0815. Test prep instructions provided with verbalized understanding. Roddie Mc [place] interpreted during call. Jossie Ng, RN ? ?

## 2021-06-22 ENCOUNTER — Other Ambulatory Visit: Payer: Self-pay

## 2021-06-22 VITALS — BP 115/74 | Temp 98.2°F | Wt 156.0 lb

## 2021-06-22 DIAGNOSIS — Z3483 Encounter for supervision of other normal pregnancy, third trimester: Secondary | ICD-10-CM

## 2021-06-22 NOTE — Progress Notes (Signed)
Patient in clinic today for a 3 hour glucose check.  Patient provided with instructions regarding the testing, patient given times to report to lab, and informed to notify nurse if any questions or concerns occur.  Glenna Fellows, FNP  ?

## 2021-06-23 LAB — GLUCOSE TOLERANCE TEST, 6 HOUR
Glucose, 1 Hour GTT: 141 mg/dL (ref 70–199)
Glucose, 2 hour: 145 mg/dL — ABNORMAL HIGH (ref 70–139)
Glucose, 3 hour: 94 mg/dL (ref 70–109)
Glucose, GTT - Fasting: 76 mg/dL (ref 70–99)

## 2021-06-30 ENCOUNTER — Ambulatory Visit: Payer: Self-pay

## 2021-07-04 ENCOUNTER — Other Ambulatory Visit: Payer: Self-pay

## 2021-07-04 ENCOUNTER — Ambulatory Visit: Payer: Self-pay | Admitting: Family Medicine

## 2021-07-04 VITALS — BP 103/64 | HR 102 | Temp 97.7°F | Wt 157.8 lb

## 2021-07-04 DIAGNOSIS — Z3482 Encounter for supervision of other normal pregnancy, second trimester: Secondary | ICD-10-CM

## 2021-07-04 DIAGNOSIS — Z3483 Encounter for supervision of other normal pregnancy, third trimester: Secondary | ICD-10-CM

## 2021-07-04 DIAGNOSIS — R7612 Nonspecific reaction to cell mediated immunity measurement of gamma interferon antigen response without active tuberculosis: Secondary | ICD-10-CM

## 2021-07-04 NOTE — Progress Notes (Signed)
[facility] Department ?Maternal Health Clinic ? ?PRENATAL VISIT NOTE ? ?Subjective:  ?Kristi Washington is a 31 y.o. G2P1001 at [redacted]w[redacted]d being seen today for ongoing prenatal care.  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 ? ?Patient reports no complaints.  Contractions: Not present. Vag. Bleeding: None.  Movement: Present. Denies leaking of fluid/ROM.  ? ?The following portions of the patient's history were reviewed and updated as appropriate: allergies, current medications, past family history, past medical history, past social history, past surgical history and problem list. Problem list updated. ? ?Objective:  ? ?Vitals:  ? 07/04/21 1044  ?BP: 103/64  ?Pulse: (!) 102  ?Temp: 97.7 ?F (36.5 ?C)  ?Weight: 157 lb 12.8 oz (71.6 kg)  ? ? ?Fetal Status: Fetal Heart Rate (bpm): 155 Fundal Height: 30 cm Movement: Present    ? ?General:  Alert, oriented and cooperative. Patient is in no acute distress.  ?Skin: Skin is warm and dry. No rash noted.   ?Cardiovascular: Normal heart rate noted  ?Respiratory: Normal respiratory effort, no problems with respiration noted  ?Abdomen: Soft, gravid, appropriate for gestational age.  Pain/Pressure: Absent     ?Pelvic: Cervical exam deferred        ?Extremities: Normal range of motion.  Edema: Trace  ?Mental Status: Normal mood and affect. Normal behavior. Normal judgment and thought content.  ? ?Assessment and Plan:  ?Pregnancy: G2P1001 at [redacted]w[redacted]d ? ?1. Prenatal care, subsequent pregnancy, second trimester ?-taking PNV as directed ?-walking 3-4 x per week for 15-20 mins.  ?-discussed 28 week labs- WNL  ?-TWG- 6lbs  ? ?2. 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 ? ?Preterm labor  symptoms and general obstetric precautions including but not limited to vaginal bleeding, contractions, leaking of fluid and fetal movement were reviewed in detail with the patient. ?Please refer to After Visit Summary for other counseling recommendations.  ?No follow-ups on file. ? ?Future Appointments  ?Date Time Provider [place]  ?07/18/2021 10:20 AM AC-MH PROVIDER AC-MAT None  ? ?[facility] agency interpreter used for Spanish interpretation.    ? ?Junious Dresser, FNP ? ?

## 2021-07-04 NOTE — Progress Notes (Signed)
Patient here for MH RV at 30 6/7. See notes from 3 hour gtt.Marland KitchenMarland KitchenBurt Knack, RN  ?

## 2021-07-05 NOTE — Progress Notes (Signed)
6 

## 2021-07-11 ENCOUNTER — Telehealth: Payer: Self-pay | Admitting: Surgery

## 2021-07-11 DIAGNOSIS — R7612 Nonspecific reaction to cell mediated immunity measurement of gamma interferon antigen response without active tuberculosis: Secondary | ICD-10-CM

## 2021-07-11 NOTE — Telephone Encounter (Signed)
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 Will coordinate with maternity to see her before/after her 10:20 visit with them.  ? ?Explained next visit is her last, she has been feeling well, only missed 1 dose of current bottle. Will update her by phone or text when her labs have been resulted.  ? ?Jennye Moccasin, MD  ?

## 2021-07-18 ENCOUNTER — Ambulatory Visit (LOCAL_COMMUNITY_HEALTH_CENTER): Payer: Self-pay

## 2021-07-18 ENCOUNTER — Ambulatory Visit: Payer: Self-pay | Admitting: Advanced Practice Midwife

## 2021-07-18 VITALS — BP 101/66 | HR 117 | Temp 97.2°F | Wt 157.4 lb

## 2021-07-18 VITALS — Wt 157.0 lb

## 2021-07-18 DIAGNOSIS — Z3483 Encounter for supervision of other normal pregnancy, third trimester: Secondary | ICD-10-CM

## 2021-07-18 DIAGNOSIS — R7612 Nonspecific reaction to cell mediated immunity measurement of gamma interferon antigen response without active tuberculosis: Secondary | ICD-10-CM

## 2021-07-18 DIAGNOSIS — Z3481 Encounter for supervision of other normal pregnancy, first trimester: Secondary | ICD-10-CM

## 2021-07-18 DIAGNOSIS — R7309 Other abnormal glucose: Secondary | ICD-10-CM

## 2021-07-18 DIAGNOSIS — Z3482 Encounter for supervision of other normal pregnancy, second trimester: Secondary | ICD-10-CM

## 2021-07-18 MED ORDER — RIFAMPIN 300 MG PO CAPS: 600.0000 mg | ORAL_CAPSULE | Freq: Every day | ORAL | 0 refills | Status: AC

## 2021-07-18 NOTE — Progress Notes (Signed)
Patient in clinic today for her last bottle of Rifampin. Rifampin 300mg  #60 dispensed to patient per Dr. . Kristi Washington orders on 04/20/21.  ?Patient began LTBI tx on 04/21/2021 and will complete tx approx 08/17/21. 10/17/21, RN  ?

## 2021-07-18 NOTE — Progress Notes (Signed)
Patient here for MH RV at 32 6/7. Kick counts reviewed and cards given. Kristi Washington saw patient today and states patient needs liver function tests today. Patient interested in BTL after delivery. TC to [employer] to clarify process for low cost BTL. LM and will expect a return call from [facility]. Burt Knack, RN  ?

## 2021-07-18 NOTE — Progress Notes (Signed)
[facility] Department ?Maternal Health Clinic ? ?PRENATAL VISIT NOTE ? ?Subjective:  ?Kristi Washington is a 31 y.o. G2P1001 at [redacted]w[redacted]d being seen today for ongoing prenatal care.  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 ? ?Patient reports no complaints.  Contractions: Not present. Vag. Bleeding: None.  Movement: Present. Denies leaking of fluid/ROM.  ? ?The following portions of the patient's history were reviewed and updated as appropriate: allergies, current medications, past family history, past medical history, past social history, past surgical history and problem list. Problem list updated. ? ?Objective:  ? ?Vitals:  ? 07/18/21 1035  ?BP: 101/66  ?Pulse: (!) 117  ?Temp: (!) 97.2 ?F (36.2 ?C)  ?Weight: 157 lb 6.4 oz (71.4 kg)  ? ? ?Fetal Status: Fetal Heart Rate (bpm): 160 Fundal Height: 32 cm Movement: Present    ? ?General:  Alert, oriented and cooperative. Patient is in no acute distress.  ?Skin: Skin is warm and dry. No rash noted.   ?Cardiovascular: Normal heart rate noted  ?Respiratory: Normal respiratory effort, no problems with respiration noted  ?Abdomen: Soft, gravid, appropriate for gestational age.  Pain/Pressure: Absent     ?Pelvic: Cervical exam deferred        ?Extremities: Normal range of motion.  Edema: None  ?Mental Status: Normal mood and affect. Normal behavior. Normal judgment and thought content.  ? ?Assessment and Plan:  ?Pregnancy: G2P1001 at [redacted]w[redacted]d ? ?1. Positive QuantiFERON-TB Gold test ?Taking Rifampin daily ?LFT's today ?- Hepatic function panel ? ?2. Prenatal care, subsequent pregnancy, second trimester ?6 lb 6.4 oz (2.903 kg) ?Not workiing ?Walking 2-3x/wk x 15-20 min ?Wants BTL pp ?- Hepatic function panel ? ? ?4. Abnormal glucose ?3 hour GTT on  06/22/21=wnl ? ? ?Preterm labor symptoms and general obstetric precautions including but not limited to vaginal bleeding, contractions, leaking of fluid and fetal movement were reviewed in detail with the patient. ?Please refer to After Visit Summary for other counseling recommendations.  ?No follow-ups on file. ? ?No future appointments. ? ?Alberteen Spindle, CNM ? ?

## 2021-07-19 LAB — HEPATIC FUNCTION PANEL
ALT: 57 IU/L — ABNORMAL HIGH (ref 0–32)
AST: 53 IU/L — ABNORMAL HIGH (ref 0–40)
Albumin: 3.6 g/dL — ABNORMAL LOW (ref 3.9–5.0)
Alkaline Phosphatase: 205 IU/L — ABNORMAL HIGH (ref 44–121)
Bilirubin Total: <0.2 mg/dL (ref 0.0–1.2)
Bilirubin, Direct: <0.1 mg/dL (ref 0.00–0.40)
Total Protein: 6.1 g/dL (ref 6.0–8.5)

## 2021-08-01 ENCOUNTER — Ambulatory Visit: Payer: Self-pay | Admitting: Advanced Practice Midwife

## 2021-08-01 VITALS — BP 114/68 | HR 99 | Temp 97.1°F | Wt 160.8 lb

## 2021-08-01 DIAGNOSIS — Z3481 Encounter for supervision of other normal pregnancy, first trimester: Secondary | ICD-10-CM

## 2021-08-01 DIAGNOSIS — Z3483 Encounter for supervision of other normal pregnancy, third trimester: Secondary | ICD-10-CM

## 2021-08-01 DIAGNOSIS — R7309 Other abnormal glucose: Secondary | ICD-10-CM

## 2021-08-01 DIAGNOSIS — R7612 Nonspecific reaction to cell mediated immunity measurement of gamma interferon antigen response without active tuberculosis: Secondary | ICD-10-CM

## 2021-08-01 NOTE — Progress Notes (Signed)
[facility] Department ?Maternal Health Clinic ? ?PRENATAL VISIT NOTE ? ?Subjective:  ?Kristi Washington is a 31 y.o. G2P1001 at [redacted]w[redacted]d being seen today for ongoing prenatal care.  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 ? ?Patient reports no complaints.  Contractions: Not present. Vag. Bleeding: None.  Movement: Present. Denies leaking of fluid/ROM.  ? ?The following portions of the patient's history were reviewed and updated as appropriate: allergies, current medications, past family history, past medical history, past social history, past surgical history and problem list. Problem list updated. ? ?Objective:  ? ?Vitals:  ? 08/01/21 1059  ?BP: 114/68  ?Pulse: 99  ?Temp: (!) 97.1 ?F (36.2 ?C)  ?Weight: 160 lb 12.8 oz (72.9 kg)  ? ? ?Fetal Status: Fetal Heart Rate (bpm): 150 Fundal Height: 34 cm Movement: Present  Presentation: Vertex ? ?General:  Alert, oriented and cooperative. Patient is in no acute distress.  ?Skin: Skin is warm and dry. No rash noted.   ?Cardiovascular: Normal heart rate noted  ?Respiratory: Normal respiratory effort, no problems with respiration noted  ?Abdomen: Soft, gravid, appropriate for gestational age.  Pain/Pressure: Absent     ?Pelvic: Cervical exam deferred        ?Extremities: Normal range of motion.  Edema: None  ?Mental Status: Normal mood and affect. Normal behavior. Normal judgment and thought content.  ? ?Assessment and Plan:  ?Pregnancy: G2P1001 at [redacted]w[redacted]d ? ?1. 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 ?2. Prenatal care, subsequent pregnancy, first trimester ?9 lb 12.8 oz (4.445 kg) ?Not working ?Walking 2-3x/wk x 15-20 min ?Wants BTL and has [facility] paperwork application to complete ?Has car seat ? ?3. Abnormal  glucose ?3 hour GTT on 06/22/21 wnl ? ? ?Preterm labor symptoms and general obstetric precautions including but not limited to vaginal bleeding, contractions, leaking of fluid and fetal movement were reviewed in detail with the patient. ?Please refer to After Visit Summary for other counseling recommendations.  ?Return in about 1 week (around 08/08/2021). ? ?Future Appointments  ?Date Time Provider Department Center  ?08/08/2021  9:00 AM AC-MH PROVIDER AC-MAT None  ? ? ?Alberteen Spindle, CNM ? ?

## 2021-08-01 NOTE — Progress Notes (Signed)
Here today for 34.6 week MH RV. Taking PNV and Rifampin QD. Denies ED/hospital visits since last RV. Tawny Hopping, RN ? ?

## 2021-08-08 ENCOUNTER — Ambulatory Visit: Payer: Self-pay | Admitting: Advanced Practice Midwife

## 2021-08-08 VITALS — BP 104/64 | HR 102 | Temp 98.0°F | Wt 162.2 lb

## 2021-08-08 DIAGNOSIS — R7612 Nonspecific reaction to cell mediated immunity measurement of gamma interferon antigen response without active tuberculosis: Secondary | ICD-10-CM

## 2021-08-08 DIAGNOSIS — Z3483 Encounter for supervision of other normal pregnancy, third trimester: Secondary | ICD-10-CM

## 2021-08-08 DIAGNOSIS — Z3481 Encounter for supervision of other normal pregnancy, first trimester: Secondary | ICD-10-CM

## 2021-08-08 DIAGNOSIS — R7309 Other abnormal glucose: Secondary | ICD-10-CM

## 2021-08-08 NOTE — Progress Notes (Signed)
Verified has [facility] contact card. Per client, mailed [facility] Financial Assistance Application to [facility] 08/07/2021. BTL self-pay info sheet given today. BTL consent signed and faxed to [facility] [employer] [facility] with confirmation received. Client given copy of consent. Jossie Ng, RN ? ?

## 2021-08-08 NOTE — Progress Notes (Signed)
[facility] Department ?Maternal Health Clinic ? ?PRENATAL VISIT NOTE ? ?Subjective:  ?Kristi Washington is a 31 y.o. G2P1001 at [redacted]w[redacted]d being seen today for ongoing prenatal care.  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 ? ?Patient reports no complaints.  Contractions: Not present. Vag. Bleeding: None.  Movement: Present. Denies leaking of fluid/ROM.  ? ?The following portions of the patient's history were reviewed and updated as appropriate: allergies, current medications, past family history, past medical history, past social history, past surgical history and problem list. Problem list updated. ? ?Objective:  ? ?Vitals:  ? 08/08/21 0850  ?BP: 104/64  ?Pulse: (!) 102  ?Temp: 98 ?F (36.7 ?C)  ?Weight: 162 lb 3.2 oz (73.6 kg)  ? ? ?Fetal Status: Fetal Heart Rate (bpm): 150 Fundal Height: 35 cm Movement: Present  Presentation: Vertex ? ?General:  Alert, oriented and cooperative. Patient is in no acute distress.  ?Skin: Skin is warm and dry. No rash noted.   ?Cardiovascular: Normal heart rate noted  ?Respiratory: Normal respiratory effort, no problems with respiration noted  ?Abdomen: Soft, gravid, appropriate for gestational age.  Pain/Pressure: Absent     ?Pelvic: Cervical exam deferred        ?Extremities: Normal range of motion.  Edema: None  ?Mental Status: Normal mood and affect. Normal behavior. Normal judgment and thought content.  ? ?Assessment and Plan:  ?Pregnancy: G2P1001 at [redacted]w[redacted]d ? ?1. Prenatal care, subsequent pregnancy, first trimester ?11 lb 3.2 oz (5.08 kg) ?Walking 3x/wk x 15-20 min ?2 lb wt gain in past week ?Has car seat, ready for baby at home ?Not working ? ?2. 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 ?3. Abnormal glucose ?3 hour GTT  wnl 06/22/21 ? ? ?Preterm labor symptoms and general obstetric precautions including but not limited to vaginal bleeding, contractions, leaking of fluid and fetal movement were reviewed in detail with the patient. ?Please refer to After Visit Summary for other counseling recommendations.  ?Return in about 1 week (around 08/15/2021) for routine PNC. ? ?No future appointments. ? ?Herbie Saxon, CNM ? ?

## 2021-08-18 ENCOUNTER — Encounter: Payer: Self-pay | Admitting: Nurse Practitioner

## 2021-08-18 ENCOUNTER — Ambulatory Visit: Payer: Self-pay | Admitting: Nurse Practitioner

## 2021-08-18 VITALS — BP 101/71 | HR 114 | Temp 98.5°F | Wt 163.2 lb

## 2021-08-18 DIAGNOSIS — Z3483 Encounter for supervision of other normal pregnancy, third trimester: Secondary | ICD-10-CM

## 2021-08-18 NOTE — Progress Notes (Signed)
Self-collecting 36 week cultures. Ryan Ogborn, RN  

## 2021-08-18 NOTE — Progress Notes (Signed)
[facility] Department ?Maternal Health Clinic ? ?PRENATAL VISIT NOTE ? ?Subjective:  ?Kristi Washington is a 31 y.o. G2P1001 at [redacted]w[redacted]d being seen today for ongoing prenatal care.  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 ? ?Patient reports no complaints.  Contractions: Not present. Vag. Bleeding: None.  Movement: Present. Denies leaking of fluid/ROM.  ? ?The following portions of the patient's history were reviewed and updated as appropriate: allergies, current medications, past family history, past medical history, past social history, past surgical history and problem list. Problem list updated. ? ?Objective:  ? ?Vitals:  ? 08/18/21 0907  ?BP: 101/71  ?Pulse: (!) 114  ?Temp: 98.5 ?F (36.9 ?C)  ?Weight: 163 lb 3.2 oz (74 kg)  ? ? ?Fetal Status: Fetal Heart Rate (bpm): 150 Fundal Height: 37 cm Movement: Present  Presentation: Vertex ? ?General:  Alert, oriented and cooperative. Patient is in no acute distress.  ?Skin: Skin is warm and dry. No rash noted.   ?Cardiovascular: Normal heart rate noted  ?Respiratory: Normal respiratory effort, no problems with respiration noted  ?Abdomen: Soft, gravid, appropriate for gestational age.  Pain/Pressure: Absent     ?Pelvic: Cervical exam deferred        ?Extremities: Normal range of motion.  Edema: None  ?Mental Status: Normal mood and affect. Normal behavior. Normal judgment and thought content.  ? ?Assessment and Plan:  ?Pregnancy: G2P1001 at [redacted]w[redacted]d ? ?1. Prenatal care, subsequent pregnancy, third trimester ?-31 year old female in clinic today for prenatal care. ?-Patient taking PNV daily. ?-No complaints today. ?-36 week pack given to patient.  Reviewed of signs of when to report to ED. ? ? ?- Culture, beta strep (group b only) ?-  Chlamydia/GC NAA, Confirmation ? ? ?Term labor symptoms and general obstetric precautions including but not limited to vaginal bleeding, contractions, leaking of fluid and fetal movement were reviewed in detail with the patient. ?Please refer to After Visit Summary for other counseling recommendations.  ? ?Return in about 1 week (around 08/25/2021) for Routine prenatal care visit. ? ?Due to a language barrier an interpreter (language [phone]) was used for the provider portion of the visit.    ? ?Future Appointments  ?Date Time Provider Department Center  ?08/25/2021  8:40 AM AC-MH PROVIDER AC-MAT None  ? ? ?Glenna Fellows, FNP ? ?

## 2021-08-22 LAB — CULTURE, BETA STREP (GROUP B ONLY): Strep Gp B Culture: NEGATIVE

## 2021-08-22 LAB — CHLAMYDIA/GC NAA, CONFIRMATION
Chlamydia trachomatis, NAA: NEGATIVE
Neisseria gonorrhoeae, NAA: NEGATIVE

## 2021-08-25 ENCOUNTER — Ambulatory Visit: Payer: Self-pay | Admitting: Family Medicine

## 2021-08-25 VITALS — BP 106/72 | HR 98 | Temp 98.3°F | Wt 165.4 lb

## 2021-08-25 DIAGNOSIS — R7612 Nonspecific reaction to cell mediated immunity measurement of gamma interferon antigen response without active tuberculosis: Secondary | ICD-10-CM

## 2021-08-25 DIAGNOSIS — Z3483 Encounter for supervision of other normal pregnancy, third trimester: Secondary | ICD-10-CM

## 2021-08-25 NOTE — Progress Notes (Signed)
Patient here for MH RV at 38 2/7..Uzma Hellmer Brewer-Jensen, RN 

## 2021-08-25 NOTE — Progress Notes (Signed)
[place] Department Maternal Health Clinic  PRENATAL VISIT NOTE  Subjective:  Kristi Washington is a 31 y.o. G2P1001 at [redacted]w[redacted]d being seen today for ongoing prenatal care.  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  Patient reports no complaints.  Contractions: Not present. Vag. Bleeding: None.  Movement: Present. Denies leaking of fluid/ROM.   The following portions of the patient's history were reviewed and updated as appropriate: allergies, current medications, past family history, past medical history, past social history, past surgical history and problem list. Problem list updated.  Objective:   Vitals:   08/25/21 0840  BP: 106/72  Pulse: 98  Temp: 98.3 F (36.8 C)  Weight: 165 lb 6.4 oz (75 kg)    Fetal Status: Fetal Heart Rate (bpm): 145 Fundal Height: 37 cm Movement: Present     General:  Alert, oriented and cooperative. Patient is in no acute distress.  Skin: Skin is warm and dry. No rash noted.   Cardiovascular: Normal heart rate noted  Respiratory: Normal respiratory effort, no problems with respiration noted  Abdomen: Soft, gravid, appropriate for gestational age.  Pain/Pressure: Absent     Pelvic: Cervical exam deferred        Extremities: Normal range of motion.  Edema: None  Mental Status: Normal mood and affect. Normal behavior. Normal judgment and thought content.   Assessment and Plan:  Pregnancy: G2P1001 at [redacted]w[redacted]d  1. Prenatal care, subsequent pregnancy, third trimester -TWG-14 lb  -Taking PNV  -Discussed 36 week lab results - neg  -ready for baby  Anticipated guidance - IOL paperwork at next visit.    2. 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 Luella Cook, MD [place] Resident.   Term labor symptoms and general obstetric precautions including but not limited to vaginal bleeding, contractions, leaking of fluid and fetal movement were reviewed in detail with the patient. Please refer to After Visit Summary for other counseling recommendations.   Return in about 1 week (around 09/01/2021) for routine prenatal care, Induction of labor paperwork.  No future appointments.   Junious Dresser, FNP

## 2021-08-30 ENCOUNTER — Encounter: Payer: Self-pay | Admitting: Nurse Practitioner

## 2021-08-30 ENCOUNTER — Ambulatory Visit: Payer: Self-pay | Admitting: Nurse Practitioner

## 2021-08-30 VITALS — BP 105/70 | HR 104 | Temp 97.8°F | Wt 166.8 lb

## 2021-08-30 DIAGNOSIS — Z3480 Encounter for supervision of other normal pregnancy, unspecified trimester: Secondary | ICD-10-CM

## 2021-08-30 DIAGNOSIS — Z3483 Encounter for supervision of other normal pregnancy, third trimester: Secondary | ICD-10-CM

## 2021-08-30 NOTE — Progress Notes (Signed)
[facility] Department Maternal Health Clinic  PRENATAL VISIT NOTE  Subjective:  Kristi Washington is a 31 y.o. G2P1001 at [redacted]w[redacted]d being seen today for ongoing prenatal care.  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  Patient reports no complaints.  Contractions: Not present. Vag. Bleeding: None.  Movement: Present. Denies leaking of fluid/ROM.   The following portions of the patient's history were reviewed and updated as appropriate: allergies, current medications, past family history, past medical history, past social history, past surgical history and problem list. Problem list updated.  Objective:   Vitals:   08/30/21 1338  BP: 105/70  Pulse: (!) 104  Temp: 97.8 F (36.6 C)  Weight: 166 lb 12.8 oz (75.7 kg)    Fetal Status: Fetal Heart Rate (bpm): 155 Fundal Height: 39 cm Movement: Present  Presentation: Vertex  General:  Alert, oriented and cooperative. Patient is in no acute distress.  Skin: Skin is warm and dry. No rash noted.   Cardiovascular: Normal heart rate noted  Respiratory: Normal respiratory effort, no problems with respiration noted  Abdomen: Soft, gravid, appropriate for gestational age.  Pain/Pressure: Absent     Pelvic: Cervical exam performed Dilation: 1 Effacement (%): 50 Station: -3  Extremities: Normal range of motion.  Edema: None  Mental Status: Normal mood and affect. Normal behavior. Normal judgment and thought content.   Assessment and Plan:  Pregnancy: G2P1001 at [redacted]w[redacted]d  1. Supervision of other normal pregnancy, antepartum -31 year old female in clinic today for prenatal care. -Patient states she is taking PNV daily. -IOL paperwork completed today. -No complaints. -15 lb 12.8 oz (7.167 kg)    Term labor symptoms  and general obstetric precautions including but not limited to vaginal bleeding, contractions, leaking of fluid and fetal movement were reviewed in detail with the patient. Please refer to After Visit Summary for other counseling recommendations.   Return in about 1 week (around 09/06/2021) for Routine prenatal care visit.  Future Appointments  Date Time Provider Department Center  09/08/2021  2:40 PM AC-MH PROVIDER AC-MAT None   Due to a language barrier an interpreter Salli Real) was used for the provider portion of the visit.     Glenna Fellows, FNP

## 2021-08-31 ENCOUNTER — Telehealth: Payer: Self-pay

## 2021-08-31 NOTE — Telephone Encounter (Signed)
Call to [place] [place] scheduler to request IOL appt for client. Requested information and number to call left on voicemail. IOL referral and snapshot pages faxed with confirmation received. Rich Number, RN

## 2021-09-08 ENCOUNTER — Telehealth: Payer: Self-pay

## 2021-09-08 ENCOUNTER — Ambulatory Visit: Payer: Self-pay | Admitting: Advanced Practice Midwife

## 2021-09-08 VITALS — BP 105/68 | HR 94 | Temp 98.1°F | Wt 170.2 lb

## 2021-09-08 DIAGNOSIS — R7612 Nonspecific reaction to cell mediated immunity measurement of gamma interferon antigen response without active tuberculosis: Secondary | ICD-10-CM

## 2021-09-08 DIAGNOSIS — Z3483 Encounter for supervision of other normal pregnancy, third trimester: Secondary | ICD-10-CM

## 2021-09-08 DIAGNOSIS — R54 Age-related physical debility: Secondary | ICD-10-CM

## 2021-09-08 DIAGNOSIS — Z3480 Encounter for supervision of other normal pregnancy, unspecified trimester: Secondary | ICD-10-CM

## 2021-09-08 NOTE — Progress Notes (Signed)
Denies contractions. Patient has Music therapist. [facility] Spanish Interpretor Roddie Mc) utilized during visit.

## 2021-09-08 NOTE — Telephone Encounter (Signed)
[facility] IOL referral faxed with confirmation on 08/31/2021 and left message to call with scheduler. Return call today (09/08/21) to Dicie [facility] IOL scheduler and left message with requested info / number to call provided. Client with assigned EDD = 09/06/2021 and IOL requested for 09/13/2021. Jossie Ng, RN

## 2021-09-08 NOTE — Progress Notes (Signed)
[facility]  PRENATAL VISIT NOTE  Subjective:  Kristi Washington is a 31 y.o. G2P1001 at [redacted]w[redacted]d being seen today for ongoing prenatal care.  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  Patient reports no complaints.  Contractions: Not present. Vag. Bleeding: None.  Movement: Present. Denies leaking of fluid/ROM.   The following portions of the patient's history were reviewed and updated as appropriate: allergies, current medications, past family history, past medical history, past social history, past surgical history and problem list. Problem list updated.  Objective:   Vitals:   09/08/21 1422  BP: 105/68  Pulse: 94  Temp: 98.1 F (36.7 C)  Weight: 170 lb 3.2 oz (77.2 kg)    Fetal Status: Fetal Heart Rate (bpm): 150 Fundal Height: 39 cm Movement: Present  Presentation: Vertex  General:  Alert, oriented and cooperative. Patient is in no acute distress.  Skin: Skin is warm and dry. No rash noted.   Cardiovascular: Normal heart rate noted  Respiratory: Normal respiratory effort, no problems with respiration noted  Abdomen: Soft, gravid, appropriate for gestational age.  Pain/Pressure: Absent     Pelvic: Cervical exam deferred Dilation: 1 Effacement (%): 50 Station: -3  Extremities: Normal range of motion.  Edema: None  Mental Status: Normal mood and affect. Normal behavior. Normal judgment and thought content.   Assessment and Plan:  Pregnancy: G2P1001 at [redacted]w[redacted]d  1. Supervision of other normal pregnancy, antepartum 19 lb 3.2 oz (8.709 kg) Knows when to go to L&D Has car seat and ready for baby at home Walking 5x/wk x 20 min Not working No IOL date given yet by [facility] Attempted to strip membranes per pt request  2.  advanced paternal age 11   3. 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 Please refer to After Visit Summary for other counseling recommendations.  No follow-ups on file.  No future appointments.  Alberteen Spindle, CNM

## 2021-09-11 NOTE — Telephone Encounter (Signed)
Per [facility], patient arrived to [facility] today (09-11-2021) in active labor.  Dahlia Bailiff, RN

## 2021-09-15 ENCOUNTER — Ambulatory Visit: Payer: Self-pay

## 2021-10-20 ENCOUNTER — Telehealth: Payer: Self-pay | Admitting: Family Medicine

## 2021-10-20 NOTE — Telephone Encounter (Signed)
Pt had her PP  appt done at [place]

## 2022-03-06 NOTE — Telephone Encounter (Signed)
Done

## 2023-03-14 IMAGING — CR DG CHEST 1V
1 series · 1 of 1 positions shown · non-contrast
Comparison: None.

CLINICAL DATA: Positive QuantiFERON test, no symptoms

EXAM:
CHEST  1 VIEW

[chest pa]
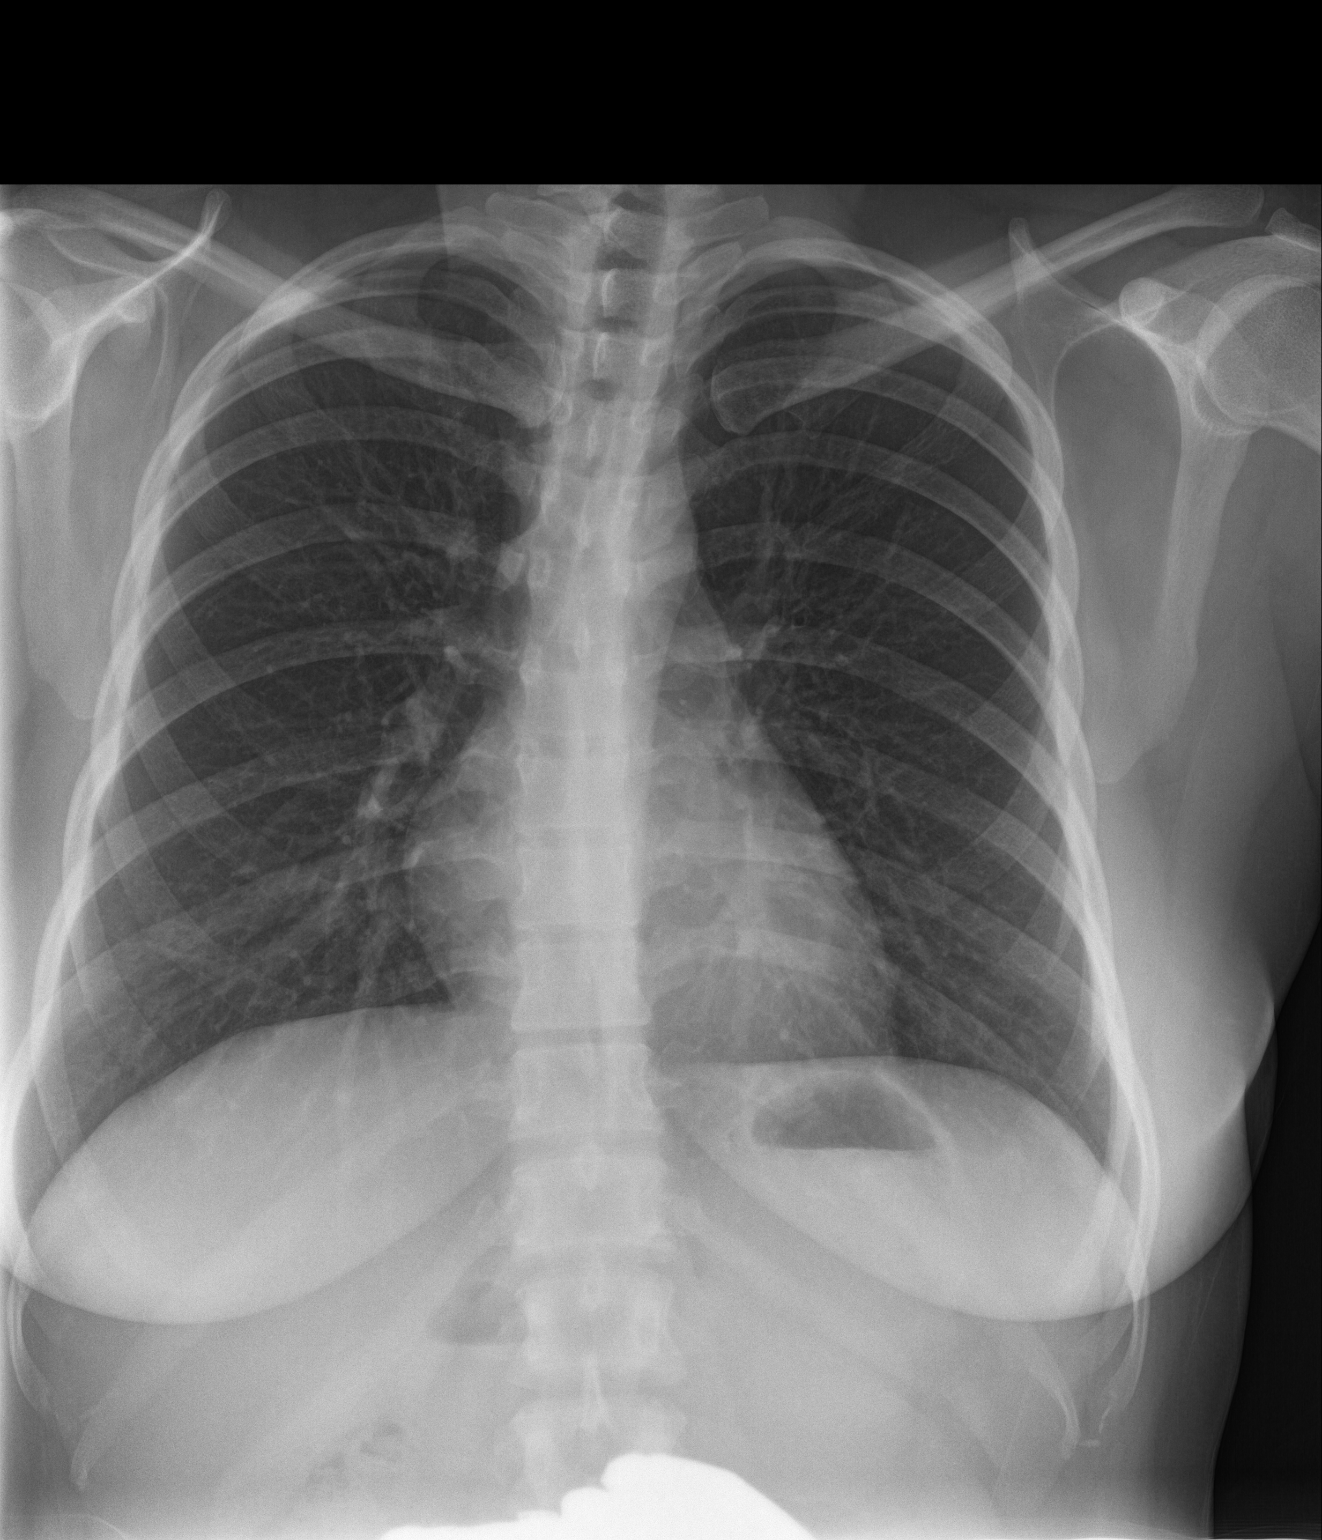

[1 of 1 positions shown; findings below may reference images not displayed]

FINDINGS: The cardiomediastinal contours are within normal limits. The lungs
are clear. No pneumothorax or pleural effusion. No acute finding in
the visualized skeleton.
IMPRESSION: No evidence of active disease.
